# Patient Record
Sex: Female | Born: 1965
Health system: Southern US, Community
[De-identification: ages and names within clinical notes are randomized; demographics above are authoritative.]

## PROBLEM LIST (undated history)

## (undated) DIAGNOSIS — M25569 Pain in unspecified knee: Secondary | ICD-10-CM

## (undated) DIAGNOSIS — H547 Unspecified visual loss: Secondary | ICD-10-CM

## (undated) DIAGNOSIS — I10 Essential (primary) hypertension: Secondary | ICD-10-CM

## (undated) DIAGNOSIS — M549 Dorsalgia, unspecified: Secondary | ICD-10-CM

## (undated) HISTORY — DX: Morbid (severe) obesity due to excess calories: E66.01

## (undated) HISTORY — DX: Unspecified visual loss: H54.7

## (undated) HISTORY — DX: Dorsalgia, unspecified: M54.9

## (undated) HISTORY — DX: Pain in unspecified knee: M25.569

## (undated) HISTORY — DX: Essential (primary) hypertension: I10

---

## 2000-10-03 ENCOUNTER — Inpatient Hospital Stay (HOSPITAL_COMMUNITY): Admission: AD | Admit: 2000-10-03 | Discharge: 2000-10-03 | Payer: Self-pay | Admitting: *Deleted

## 2000-10-11 ENCOUNTER — Inpatient Hospital Stay (HOSPITAL_COMMUNITY): Admission: RE | Admit: 2000-10-11 | Discharge: 2000-10-14 | Payer: Self-pay | Admitting: Obstetrics & Gynecology

## 2000-10-17 ENCOUNTER — Inpatient Hospital Stay (HOSPITAL_COMMUNITY): Admission: AD | Admit: 2000-10-17 | Discharge: 2000-10-17 | Payer: Self-pay | Admitting: Obstetrics

## 2000-10-20 ENCOUNTER — Inpatient Hospital Stay (HOSPITAL_COMMUNITY): Admission: AD | Admit: 2000-10-20 | Discharge: 2000-10-20 | Payer: Self-pay | Admitting: *Deleted

## 2000-10-26 ENCOUNTER — Inpatient Hospital Stay (HOSPITAL_COMMUNITY): Admission: AD | Admit: 2000-10-26 | Discharge: 2000-10-26 | Payer: Self-pay | Admitting: Obstetrics & Gynecology

## 2002-01-28 ENCOUNTER — Encounter: Admission: RE | Admit: 2002-01-28 | Discharge: 2002-01-28 | Payer: Self-pay | Admitting: *Deleted

## 2002-01-28 ENCOUNTER — Encounter: Payer: Self-pay | Admitting: *Deleted

## 2002-01-28 ENCOUNTER — Ambulatory Visit (HOSPITAL_COMMUNITY): Admission: RE | Admit: 2002-01-28 | Discharge: 2002-01-28 | Payer: Self-pay | Admitting: *Deleted

## 2002-01-30 ENCOUNTER — Encounter: Admission: RE | Admit: 2002-01-30 | Discharge: 2002-01-30 | Payer: Self-pay | Admitting: *Deleted

## 2002-02-04 ENCOUNTER — Ambulatory Visit (HOSPITAL_COMMUNITY): Admission: RE | Admit: 2002-02-04 | Discharge: 2002-02-04 | Payer: Self-pay | Admitting: Obstetrics and Gynecology

## 2003-11-13 ENCOUNTER — Other Ambulatory Visit: Admission: RE | Admit: 2003-11-13 | Discharge: 2003-11-13 | Payer: Self-pay | Admitting: Physician Assistant

## 2013-01-09 DIAGNOSIS — H547 Unspecified visual loss: Secondary | ICD-10-CM

## 2013-01-09 HISTORY — DX: Unspecified visual loss: H54.7

## 2015-09-07 ENCOUNTER — Ambulatory Visit (INDEPENDENT_AMBULATORY_CARE_PROVIDER_SITE_OTHER): Payer: Self-pay | Admitting: Internal Medicine

## 2015-09-07 ENCOUNTER — Encounter: Payer: Self-pay | Admitting: Internal Medicine

## 2015-09-07 DIAGNOSIS — Z23 Encounter for immunization: Secondary | ICD-10-CM

## 2015-09-07 DIAGNOSIS — H547 Unspecified visual loss: Secondary | ICD-10-CM

## 2015-09-07 DIAGNOSIS — Z1239 Encounter for other screening for malignant neoplasm of breast: Secondary | ICD-10-CM

## 2015-09-07 DIAGNOSIS — M25562 Pain in left knee: Secondary | ICD-10-CM

## 2015-09-07 DIAGNOSIS — M25561 Pain in right knee: Secondary | ICD-10-CM

## 2015-09-07 NOTE — Progress Notes (Signed)
Subjective:    Patient ID: Emily Mcgee, female    DOB: 08/31/1965, 50 y.o.   MRN: [id number]  HPI   New patient to establish Originally from [place]  [place].  Concerned about weight:   States weighed between 140-150 lbs when young adult.  When started having children, of which she has 3, she started gaining weight "let go"  Of herself.  Has made intermittent attempts to increase her physical activity, but is not sustained.   Has knee pain when walks for any period of time. LIkes to get up in the morning, eventually goes back to sleep as she used to work 3rd shift and has not been able to get her sleep schedule back to normal range.  Works from 2 p.m. To 9 p.m., hours are less on other days.   Often eats dinner after 10 p.m. Reads, watches TV until 12 to 1 a.m.  In living room.  Goes to bed at 1-2 a.m.  Sleeps until late morning or noon.  But will get up 6:30 a.m. Now that school has started and once girl is out the door, around 8:30, back to bed.  Diet:   First meal around 1-2 p.m.:  Chic Fila:  Salad with chicken and coffee.   Snacks on Corn Nuts and peanuts and snacks on them all day long. May eat an apple,   Second meal:  Boiled white rice with some sort of meat:  Goat, beef, chicken, etc.  Vegetables with dinner.  Current Meds  Medication Sig  . aspirin 500 MG tablet Take 1,000 mg by mouth as needed for pain.  . Naproxen Sodium (ALEVE) 220 MG CAPS Take 2 capsules by mouth as needed.    No Known Allergies   Past Medical History:  Diagnosis Date  . Back pain   . Decreased visual acuity 2015   bifocals  . Knee pain   . Morbid obesity (HCC)     Past Surgical History:  Procedure Laterality Date  . CESAREAN SECTION  1995, 1997, 2002    Social History   Social History  . Marital status: Married    Spouse name: Emily Mcgee  . Number of children: 3  . Years of education: college   Occupational History  . [employer] managersecretary     Planning to go to school for  [employer]   Social History Main Topics  . Smoking status: Never Smoker  . Smokeless tobacco: Never Used  . Alcohol use 0.6 oz/week    1 Glasses of wine per week  . Drug use: No  . Sexual activity: Not on file   Other Topics Concern  . Not on file   Social History Narrative  . No narrative on file        Review of Systems     Objective:   Physical Exam Morbidly obese NAD Lungs:  CTA CV:  RRR with normal S1 and S2, NO S3, S4, or murmur, radial pulses normal and equal Extrems:  Obese, + DP pulses bilaterally.       Assessment & Plan:  1.  Morbid obesity:  Long discussion regarding making short term goals to meet long term goal of weight loss.   To set a regular schedule for herself with sleep and wake as well as meals. Work on how she eats and gradually increasing physical activity. FLP, CBC, CMP, TSH today  2.  Knee pain:  Water exercise or stationary bike to get weight off legs/joints.  Discussed  her pain will likely not be relieved until she loses weight.  3. Visual Acuity:  Referral to OPtometry and glass voucher  4.  Elevated BP:  Labs above.  Return for bp check with nurse in 1 week.  If still as elevated, start medication

## 2015-09-07 NOTE — Patient Instructions (Addendum)
Drink a glass of water before every meal Drink 6-8 glasses of water daily Eat three meals daily Eat a protein and healthy fat with every meal (eggs,fish, chicken, [place]key and limit red meats) Eat 5 servings of vegetables daily, mix the colors Eat 2 servings of fruit daily with skin, if skin is edible Use smaller plates Put food/utensils down as you chew and swallow each bite Eat at a table with friends/family at least once daily, no TV Do not eat in front of the TV  Get out your calendar and get started on writing down short term goals --get a daily schedule written out for sleep, meals, snacks, and physical activity! Remember to be consistent on a daily basis with how you eat and are physically active

## 2015-09-08 LAB — CBC WITH DIFFERENTIAL/PLATELET
Basophils Absolute: 0 10*3/uL (ref 0.0–0.2)
Basos: 0 %
EOS (ABSOLUTE): 0.2 10*3/uL (ref 0.0–0.4)
Eos: 3 %
Hematocrit: 37.7 % (ref 34.0–46.6)
Hemoglobin: 11.8 g/dL (ref 11.1–15.9)
Immature Grans (Abs): 0 10*3/uL (ref 0.0–0.1)
Immature Granulocytes: 0 %
Lymphocytes Absolute: 2.2 10*3/uL (ref 0.7–3.1)
Lymphs: 31 %
MCH: 24.7 pg — ABNORMAL LOW (ref 26.6–33.0)
MCHC: 31.3 g/dL — ABNORMAL LOW (ref 31.5–35.7)
MCV: 79 fL (ref 79–97)
Monocytes Absolute: 0.4 10*3/uL (ref 0.1–0.9)
Monocytes: 6 %
Neutrophils Absolute: 4.3 10*3/uL (ref 1.4–7.0)
Neutrophils: 60 %
Platelets: 283 10*3/uL (ref 150–379)
RBC: 4.78 x10E6/uL (ref 3.77–5.28)
RDW: 16.6 % — ABNORMAL HIGH (ref 12.3–15.4)
WBC: 7 10*3/uL (ref 3.4–10.8)

## 2015-09-08 LAB — LIPID PANEL W/O CHOL/HDL RATIO
Cholesterol, Total: 229 mg/dL — ABNORMAL HIGH (ref 100–199)
HDL: 61 mg/dL (ref >39)
LDL Calculated: 155 mg/dL — ABNORMAL HIGH (ref 0–99)
Triglycerides: 65 mg/dL (ref 0–149)
VLDL Cholesterol Cal: 13 mg/dL (ref 5–40)

## 2015-09-08 LAB — COMPREHENSIVE METABOLIC PANEL
ALT: 15 IU/L (ref 0–32)
AST: 13 IU/L (ref 0–40)
Albumin/Globulin Ratio: 1.1 — ABNORMAL LOW (ref 1.2–2.2)
Albumin: 3.7 g/dL (ref 3.5–5.5)
Alkaline Phosphatase: 59 IU/L (ref 39–117)
BUN/Creatinine Ratio: 20 (ref 9–23)
BUN: 16 mg/dL (ref 6–24)
Bilirubin Total: 0.4 mg/dL (ref 0.0–1.2)
CO2: 29 mmol/L (ref 18–29)
Calcium: 9.1 mg/dL (ref 8.7–10.2)
Chloride: 102 mmol/L (ref 96–106)
Creatinine, Ser: 0.79 mg/dL (ref 0.57–1.00)
GFR calc Af Amer: 100 mL/min/{1.73_m2} (ref >59)
GFR calc non Af Amer: 87 mL/min/{1.73_m2} (ref >59)
Globulin, Total: 3.4 g/dL (ref 1.5–4.5)
Glucose: 89 mg/dL (ref 65–99)
Potassium: 4.2 mmol/L (ref 3.5–5.2)
Sodium: 144 mmol/L (ref 134–144)
Total Protein: 7.1 g/dL (ref 6.0–8.5)

## 2015-09-08 LAB — TSH: TSH: 1.28 u[IU]/mL (ref 0.450–4.500)

## 2015-09-15 ENCOUNTER — Ambulatory Visit (INDEPENDENT_AMBULATORY_CARE_PROVIDER_SITE_OTHER): Payer: Self-pay

## 2015-09-15 VITALS — BP 152/90 | HR 69

## 2015-09-15 DIAGNOSIS — Z111 Encounter for screening for respiratory tuberculosis: Secondary | ICD-10-CM

## 2015-09-15 DIAGNOSIS — R030 Elevated blood-pressure reading, without diagnosis of hypertension: Secondary | ICD-10-CM

## 2015-09-15 DIAGNOSIS — IMO0001 Reserved for inherently not codable concepts without codable children: Secondary | ICD-10-CM

## 2015-09-15 MED ORDER — LISINOPRIL 5 MG PO TABS: 5.0000 mg | ORAL_TABLET | Freq: Every day | ORAL | 11 refills | Status: DC

## 2015-09-15 NOTE — Patient Instructions (Signed)
Start Lisinopril and follow-up in one week. Come back in 48-72 hours for PPD reading

## 2015-09-17 LAB — TB SKIN TEST: TB Skin Test: POSITIVE

## 2015-09-17 NOTE — Progress Notes (Signed)
Patient had a PPD skin test placed on 09-15-2015 at 11:10am and was read on 09/17/2015 at 12:45pm. 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 [facility]

## 2015-09-20 ENCOUNTER — Ambulatory Visit
Admission: RE | Admit: 2015-09-20 | Discharge: 2015-09-20 | Disposition: A | Discharge status: Home / Self Care | Payer: No Typology Code available for payment source | Source: Ambulatory Visit | Attending: Infectious Disease | Admitting: Infectious Disease

## 2015-09-20 ENCOUNTER — Other Ambulatory Visit: Payer: Self-pay | Admitting: Infectious Disease

## 2015-09-20 DIAGNOSIS — R7611 Nonspecific reaction to tuberculin skin test without active tuberculosis: Secondary | ICD-10-CM

## 2015-09-29 ENCOUNTER — Ambulatory Visit (INDEPENDENT_AMBULATORY_CARE_PROVIDER_SITE_OTHER): Payer: Self-pay

## 2015-09-29 VITALS — BP 148/82 | HR 74

## 2015-09-29 DIAGNOSIS — Z79899 Other long term (current) drug therapy: Secondary | ICD-10-CM

## 2015-09-29 NOTE — Progress Notes (Signed)
Patient came in this afternoon for a BMP and blood pressure check. She asked about her TB [facility] referral [facility]. She says they sent her for a chest x-ray and after the results there was supposed to be a three specimen sputum test but she could not give a sample. She explained that the health department was supposed to fax something over to this office so more testing can be done. I am not aware of anything coming over from the health department for her. I faxed a request over today for the information and am calling the clinic. 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

## 2015-09-30 LAB — BASIC METABOLIC PANEL
BUN/Creatinine Ratio: 15 (ref 9–23)
BUN: 12 mg/dL (ref 6–24)
CALCIUM: 8.9 mg/dL (ref 8.7–10.2)
CHLORIDE: 100 mmol/L (ref 96–106)
CO2: 30 mmol/L — AB (ref 18–29)
Creatinine, Ser: 0.8 mg/dL (ref 0.57–1.00)
GFR calc Af Amer: 99 mL/min/{1.73_m2} (ref >59)
GFR calc non Af Amer: 86 mL/min/{1.73_m2} (ref >59)
Glucose: 86 mg/dL (ref 65–99)
POTASSIUM: 4.4 mmol/L (ref 3.5–5.2)
Sodium: 141 mmol/L (ref 134–144)

## 2015-10-08 ENCOUNTER — Telehealth: Payer: Self-pay | Admitting: Internal Medicine

## 2015-10-08 DIAGNOSIS — R9389 Abnormal findings on diagnostic imaging of other specified body structures: Secondary | ICD-10-CM

## 2015-10-08 DIAGNOSIS — R7611 Nonspecific reaction to tuberculin skin test without active tuberculosis: Secondary | ICD-10-CM

## 2015-10-08 NOTE — Telephone Encounter (Signed)
[facility] scheduled [facility] Appointment is on 10/11/2015 at 4:00 pm.

## 2015-10-08 NOTE — Telephone Encounter (Signed)
[facility] is okay [facility]. Patient will apply for financial assistance.

## 2015-10-08 NOTE — Telephone Encounter (Signed)
Patient aware of appointment and will stop by the office to pick up application for financial assist.

## 2015-10-11 ENCOUNTER — Ambulatory Visit (HOSPITAL_COMMUNITY)
Admission: RE | Admit: 2015-10-11 | Discharge: 2015-10-11 | Disposition: A | Discharge status: Home / Self Care | Payer: Self-pay | Source: Ambulatory Visit | Attending: Internal Medicine | Admitting: Internal Medicine

## 2015-10-11 DIAGNOSIS — R9389 Abnormal findings on diagnostic imaging of other specified body structures: Secondary | ICD-10-CM

## 2015-10-11 DIAGNOSIS — R918 Other nonspecific abnormal finding of lung field: Secondary | ICD-10-CM | POA: Insufficient documentation

## 2015-10-11 DIAGNOSIS — R7611 Nonspecific reaction to tuberculin skin test without active tuberculosis: Secondary | ICD-10-CM | POA: Insufficient documentation

## 2015-10-11 DIAGNOSIS — R938 Abnormal findings on diagnostic imaging of other specified body structures: Secondary | ICD-10-CM | POA: Insufficient documentation

## 2015-10-12 NOTE — Progress Notes (Signed)
[facility] printed [facility] on 16/10/960410/03/2015.

## 2015-11-30 NOTE — Progress Notes (Signed)
This task was completed.

## 2015-12-07 ENCOUNTER — Ambulatory Visit: Payer: Self-pay | Admitting: Internal Medicine

## 2016-01-24 ENCOUNTER — Ambulatory Visit: Payer: Self-pay | Admitting: Internal Medicine

## 2016-02-09 ENCOUNTER — Ambulatory Visit: Payer: Self-pay | Admitting: Internal Medicine

## 2016-02-21 ENCOUNTER — Ambulatory Visit: Payer: Self-pay | Admitting: Internal Medicine

## 2016-02-21 ENCOUNTER — Encounter: Payer: Self-pay | Admitting: Internal Medicine

## 2016-02-21 ENCOUNTER — Ambulatory Visit (INDEPENDENT_AMBULATORY_CARE_PROVIDER_SITE_OTHER): Payer: Self-pay | Admitting: Internal Medicine

## 2016-02-21 VITALS — BP 132/90 | HR 70 | Resp 12 | Ht 61.5 in | Wt 350.0 lb

## 2016-02-21 DIAGNOSIS — Z1231 Encounter for screening mammogram for malignant neoplasm of breast: Secondary | ICD-10-CM

## 2016-02-21 DIAGNOSIS — Z124 Encounter for screening for malignant neoplasm of cervix: Secondary | ICD-10-CM

## 2016-02-21 DIAGNOSIS — Z0000 Encounter for general adult medical examination without abnormal findings: Secondary | ICD-10-CM

## 2016-02-21 DIAGNOSIS — H547 Unspecified visual loss: Secondary | ICD-10-CM

## 2016-02-21 DIAGNOSIS — Z1239 Encounter for other screening for malignant neoplasm of breast: Secondary | ICD-10-CM

## 2016-02-21 LAB — POCT WET PREP WITH KOH
KOH Prep POC: NEGATIVE
RBC Wet Prep HPF POC: NEGATIVE
TRICHOMONAS UA: NEGATIVE
YEAST WET PREP PER HPF POC: NEGATIVE

## 2016-02-21 NOTE — Patient Instructions (Addendum)
Can google "advance directives, [place]"  And bring up form from Secretary of State. Print and fill out Or can go to "5 wishes"  Which is also in Spanish and fill out--this costs $5--perhaps easier to use. Designate a Medical Power of Attorney to speak for you if you are unable to speak for yourself when ill or injured  Drink a glass of water before every meal Drink 6-8 glasses of water daily Eat three meals daily Eat a protein and healthy fat with every meal (eggs,fish, chicken, turkey and limit red meats) Eat 5 servings of vegetables daily, mix the colors Eat 2 servings of fruit daily with skin, if skin is edible Use smaller plates Put food/utensils down as you chew and swallow each bite Eat at a table with friends/family at least once daily, no TV Do not eat in front of the TV 

## 2016-02-21 NOTE — Progress Notes (Signed)
Subjective:    Patient ID: Emily Mcgee, female    DOB: 09/15/1965, 51 y.o.   MRN: [id number]  HPI  CPE with pap  1.  Pap:  Last pap at [facility].  Was normal as always.  Thinks was 2 years ago.  No family history.  2.  Mammogram:  Had one before age 69 yo.  Not sure why she had it.  Normal. Was here in [place] somewhere.  No family history  3.  Osteoprevention:  Eats yogurt, but not every day.  Drinks milk rarely and only in coffee.  Does take multivitamin, but not calcium and Vitamin D specifically.  Does walk in her apartment complex, maybe for 20 minutes.  4.  Guaiac Cards:  Never.  No family history.  5.  Colonoscopy:  Never.  6.  Immunizations:  Immunization History  Administered Date(s) Administered  . Influenza,inj,Quad PF,36+ Mos 10/23/2015  . PPD Test 09/15/2015  . Tdap 09/07/2015   Current Meds  Medication Sig  . [DISCONTINUED] aspirin 500 MG tablet Take 1,000 mg by mouth as needed for pain.    No Known Allergies   Past Medical History:  Diagnosis Date  . Back pain   . Decreased visual acuity 2015   bifocals  . Knee pain   . Morbid obesity (HCC)     Past Surgical History:  Procedure Laterality Date  . CESAREAN SECTION  1995, 1997, 2002    Family History  Problem Relation Age of Onset  . Stroke Father     Social History   Social History  . Marital status: Married    Spouse name: Emily Mcgee  . Number of children: 3  . Years of education: college   Occupational History  . 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 Smoking status: Never Smoker  . Smokeless tobacco: Never Used  . Alcohol use 0.6 oz/week    1 Glasses of wine per week     Comment: rare  . Drug use: No  . Sexual activity: No   Other Topics Concern  . Not on file   Social History Narrative   Originally from [place]   Came to [place]. In 1999   Lives at home with 3 children and grandson.   Husband in  [place], he comes and goes from [place].    Review of Systems  Constitutional: Negative for appetite change and fatigue.  HENT: Negative for dental problem, ear pain, hearing loss and sneezing.   Eyes: Positive for visual disturbance.       Needs voucher for eyeglass Rx  Respiratory: Negative for cough.   Cardiovascular: Negative for chest pain, palpitations and leg swelling.  Gastrointestinal: Negative for abdominal pain, blood in stool, constipation, diarrhea, nausea and vomiting.       No melena  Genitourinary: Negative for dysuria, frequency and vaginal discharge.  Musculoskeletal: Positive for arthralgias.       Chronic knee pain .  Received an Rx from Dr. Roderic Palau at her church, but did not help.  Asked her to pick one primary care doctor for safety issues  Skin: Negative for rash.  Neurological: Negative for weakness and numbness.  Hematological: Negative for adenopathy. Does not bruise/bleed easily.  Psychiatric/Behavioral: Negative for dysphoric mood. The patient is not nervous/anxious.        Objective:   Physical Exam  Constitutional: She is oriented to person, place, and time. She appears  well-developed and well-nourished.  Morbidly obese  HENT:  Head: Normocephalic and atraumatic.  Right Ear: Hearing, tympanic membrane, external ear and ear canal normal.  Left Ear: Hearing, tympanic membrane, external ear and ear canal normal.  Nose: Nose normal.  Mouth/Throat: Uvula is midline, oropharynx is clear and moist and mucous membranes are normal.  Eyes: Conjunctivae and EOM are normal. Pupils are equal, round, and reactive to light.  Discs sharp bilaterally  Neck: Normal range of motion. Neck supple. No thyroid mass and no thyromegaly present.  Cardiovascular: Normal rate, regular rhythm, S1 normal and S2 normal.  Exam reveals no S3, no S4 and no friction rub.   No murmur heard. No carotid bruits.  Carotid, radial, femoral, DP and PT pulses normal and equal.   Pulmonary/Chest:  Effort normal and breath sounds normal. Right breast exhibits no inverted nipple, no mass, no nipple discharge, no skin change and no tenderness. Left breast exhibits no inverted nipple, no mass, no nipple discharge, no skin change and no tenderness.  Large pendulous breasts.  Abdominal: Soft. Bowel sounds are normal. She exhibits no mass. There is no hepatosplenomegaly. There is no tenderness. No hernia.  Genitourinary: Vagina normal. There is no rash on the right labia. There is no rash on the left labia. Cervix exhibits no motion tenderness. Right adnexum displays no mass and no tenderness. Left adnexum displays no mass and no tenderness.  Genitourinary Comments: Difficulty fully visualizing lower spect of cervix, small uterus quite high with cervix retroverted.  Scant white/light yellow vaginal discharge.  Musculoskeletal: Normal range of motion.  Left knee with tenderness more so at pes anserine bursa site.  No definite joint line tenderness or pain/laxity with stress of cruciates or collateral ligaments.  Lymphadenopathy:       Head (right side): No submental and no submandibular adenopathy present.       Head (left side): No submental and no submandibular adenopathy present.    She has no cervical adenopathy.    She has no axillary adenopathy.       Right: No inguinal and no supraclavicular adenopathy present.       Left: No inguinal and no supraclavicular adenopathy present.  Neurological: She is alert and oriented to person, place, and time. She has normal strength and normal reflexes. She displays normal reflexes. No cranial nerve deficit or sensory deficit. Coordination and gait normal.  Skin: Skin is warm and dry. No lesion and no rash noted.  Psychiatric: She has a normal mood and affect. Her speech is normal and behavior is normal. Judgment and thought content normal. Cognition and memory are normal.          Assessment & Plan:  1.  CPE with pap: Mammogram to be scheduled. Had  labs in fall. Wet prep with clue cells only and asymptomatic.  2.  Morbid obesity:  Encourage Y app and get into pool for physical activity with knee pain and obesity. Long discussion regarding changing diet. Follow up for lifestyle changes in  6 weeks and bp check  3.  Essential Hypertension:  Encouraged to get back on Lisinopril.  BP check in 6 weeks.

## 2016-02-24 LAB — CYTOLOGY - PAP

## 2016-02-28 NOTE — Progress Notes (Signed)
Left message for patient

## 2016-04-03 ENCOUNTER — Ambulatory Visit: Payer: Self-pay | Admitting: Internal Medicine

## 2017-08-07 ENCOUNTER — Encounter: Payer: Self-pay | Admitting: Family Medicine

## 2017-08-07 ENCOUNTER — Ambulatory Visit: Payer: Self-pay | Admitting: Family Medicine

## 2017-08-07 DIAGNOSIS — I10 Essential (primary) hypertension: Secondary | ICD-10-CM

## 2017-08-07 DIAGNOSIS — M25561 Pain in right knee: Secondary | ICD-10-CM

## 2017-08-07 NOTE — Patient Instructions (Signed)
Good to see you today!  Thanks for coming in.  For your knee pain - will order an xray - [facility] and apply for financial aid - keep taking aleve but add Tylenol arthritis up to three times a day  - also try Aspercreme four times a day  Congratulations on your weight loss - keep going !  That will definitely help with your knee pain   Check your blood pressure at our church.  It should be less than 140/90. Write down the readings and bring in to Dr Delrae AlfredMulberry  Come back in one -two months

## 2017-08-07 NOTE — Assessment & Plan Note (Signed)
Most likely DJD.  Never had an xray.  Given age need to rule out occult cancer or fracture,   Try otc analgesics - see after visit summary .  Encourage weight loss

## 2017-08-07 NOTE — Progress Notes (Signed)
Subjective  Emily Mcgee is a 52 y.o. female is presenting with the following  JOINT PAIN Location: R knee all over.   Course:  Has had on an off for 3 years.   Is worse with activity or after sitting  with:  Aleve helps some Trauma: no Swelling:  She thinks is swollen sometime Locking:  no Other Joints involved:  no Rash: no Fever:  no  HYPERTENSION Disease Monitoring  Home BP Monitoring (Severity) has not been taking. Feels is high today because worked all night Symptoms - Chest pain- no    Dyspnea- no Medications (Modifying factors) Compliance-  Has not taken any medications for months. Lightheadedness-  no  Edema- no Timing - continuous  Duration - years ROS - See HPI  OBESITY Has lost almost 40 lbs.  Trying to eat better and getting some exercise.  She was surprised how much she had lost Hopes to lose more.  PMH Lab Review   Potassium  Date Value Ref Range Status  09/29/2015 4.4 3.5 - 5.2 mmol/L Final   Sodium  Date Value Ref Range Status  09/29/2015 141 134 - 144 mmol/L Final   Creatinine, Ser  Date Value Ref Range Status  09/29/2015 0.80 0.57 - 1.00 mg/dL Final          Chief Complaint noted Review of Symptoms - see HPI PMH - Smoking status noted.    Objective Vital Signs reviewed BP (!) 150/100 (BP Location: Left Arm, Patient Position: Sitting, Cuff Size: Large)   Pulse 84   Resp 14   Ht 5' 1.5" (1.562 m)   Wt (!) 312 lb (141.5 kg)   LMP  (LMP Unknown)   BMI 58.00 kg/m  Knees - very obese with no observable land marks.  FROM with mild pain. Unable to tell if has an effusion.  Ankle and hip exams normal.  Is tender diffusely with no detectavble laxity No  Pedal edema  Assessments/Plans  See after visit summary for details of patient instuctions  Right knee pain Most likely DJD.  Never had an xray.  Given age need to rule out occult cancer or fracture,   Try otc analgesics - see after visit summary .  Encourage weight loss   Morbid obesity  (HCC) Some improvement.  Continue current approaches   Hypertension BP Readings from Last 3 Encounters:  08/07/17 (!) 150/100  02/21/16 132/90  09/29/15 (!) 148/82   Not at goal.  Monitor and bring in readings

## 2017-08-07 NOTE — Assessment & Plan Note (Signed)
Some improvement.  Continue current approaches

## 2017-08-07 NOTE — Assessment & Plan Note (Signed)
BP Readings from Last 3 Encounters:  08/07/17 (!) 150/100  02/21/16 132/90  09/29/15 (!) 148/82   Not at goal.  Monitor and bring in readings

## 2017-09-20 ENCOUNTER — Ambulatory Visit: Payer: Self-pay

## 2017-10-02 IMAGING — CT CT CHEST W/O CM
2 of 4 series · 15 of 36 positions shown, 18 images · non-contrast
Comparison: Chest radiograph 09/20/2015

CLINICAL DATA: Positive PPD with abnormal chest x-ray.

EXAM:
CT CHEST WITHOUT CONTRAST
TECHNIQUE: Multidetector CT imaging of the chest was performed following the
standard protocol without IV contrast.

[Series 2: chest w/o 2mm st · axial · non-contrast · 0.61mm/px · z∈[-146,+90]mm · 12 of 140 slices shown, 15 images]
[im 11/140  mediastinal]
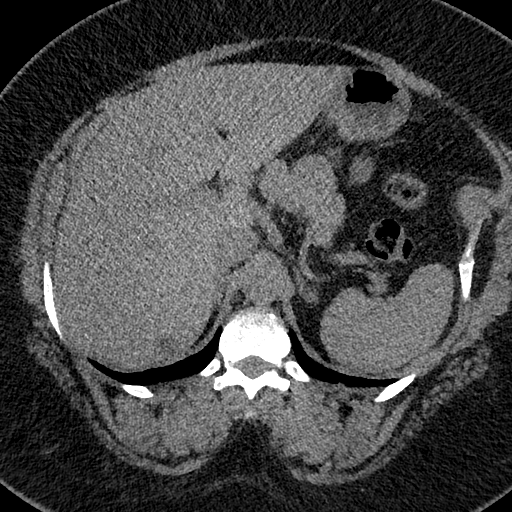
[im 11/140  lung]
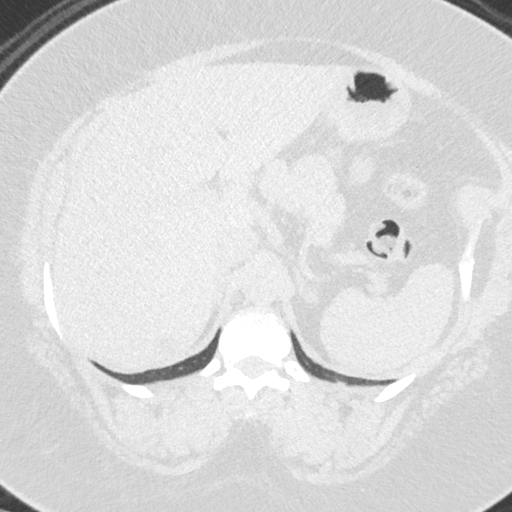
[im 22/140  lung]
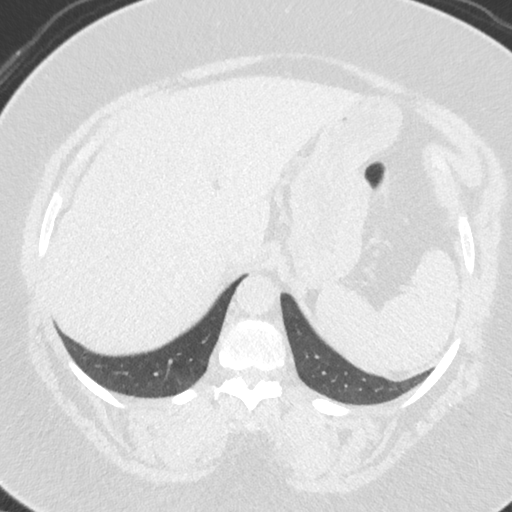
[im 33/140  lung]
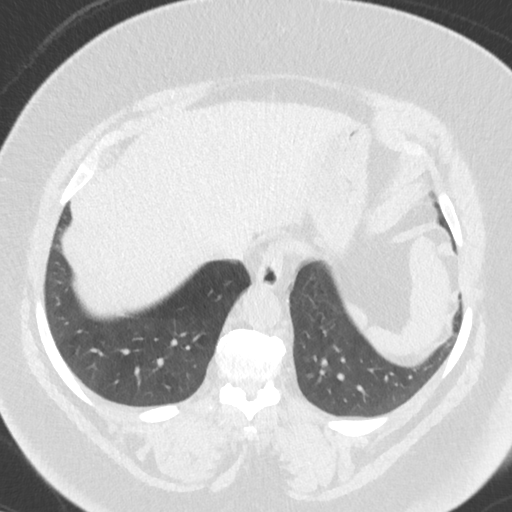
[im 43/140  lung]
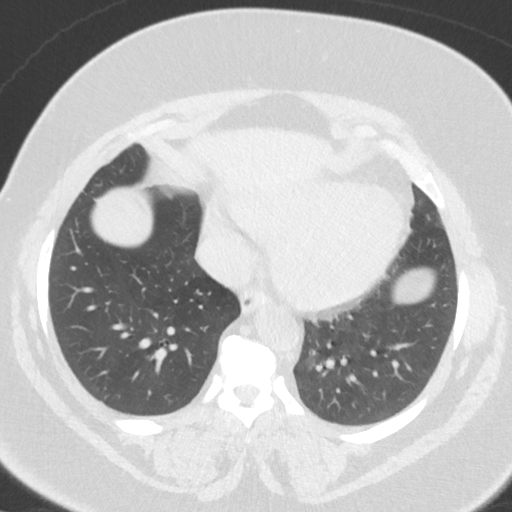
[im 54/140  mediastinal]
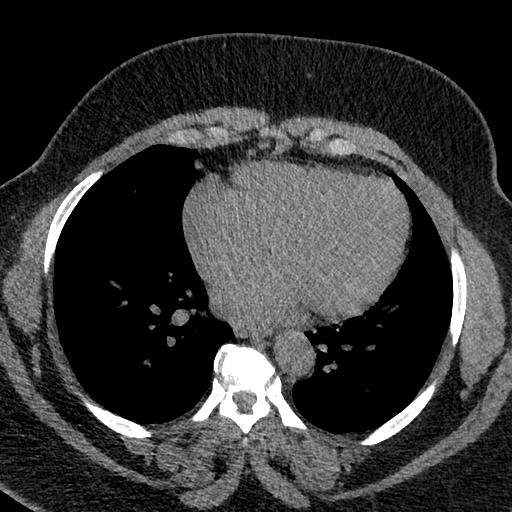
[im 54/140  lung]
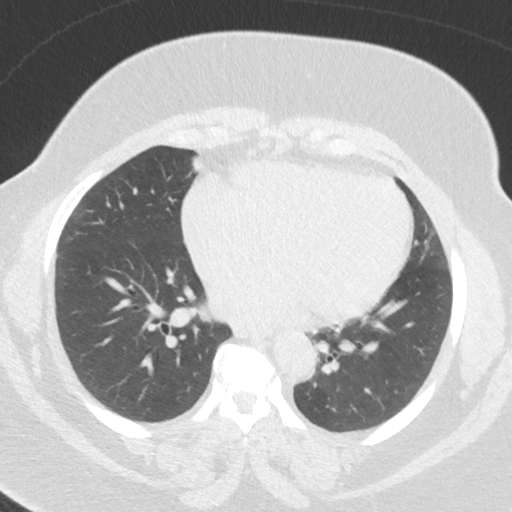
[im 65/140  lung]
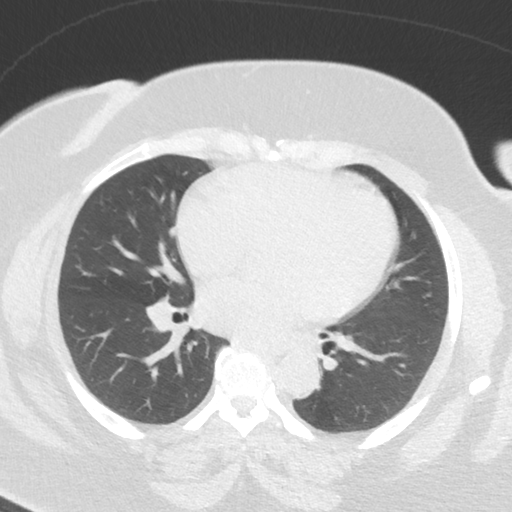
[im 75/140  lung]
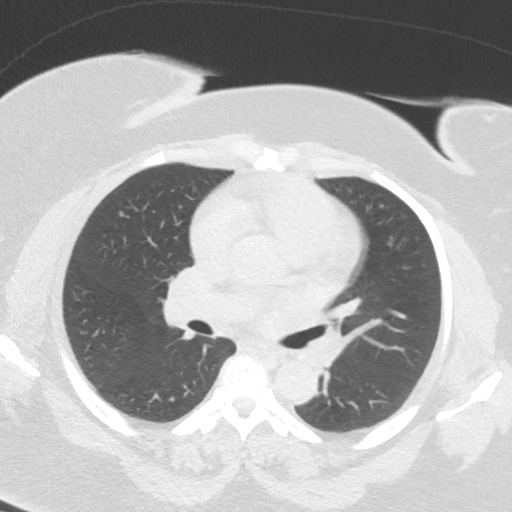
[im 86/140  lung]
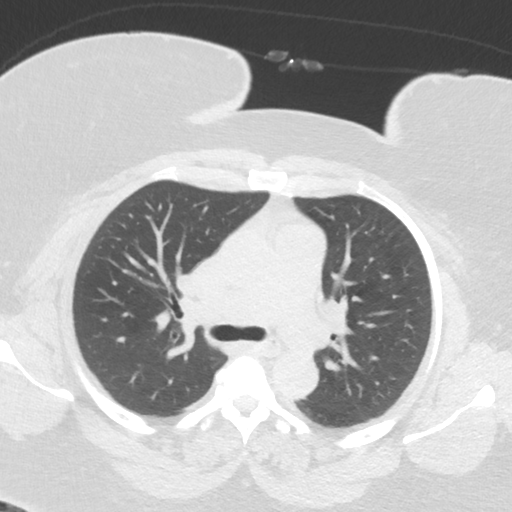
[im 97/140  mediastinal]
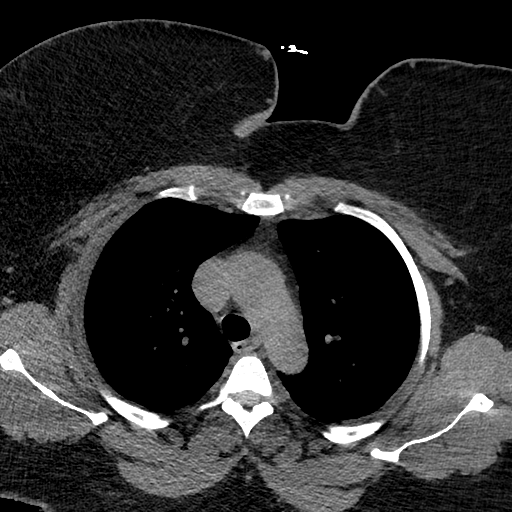
[im 97/140  lung]
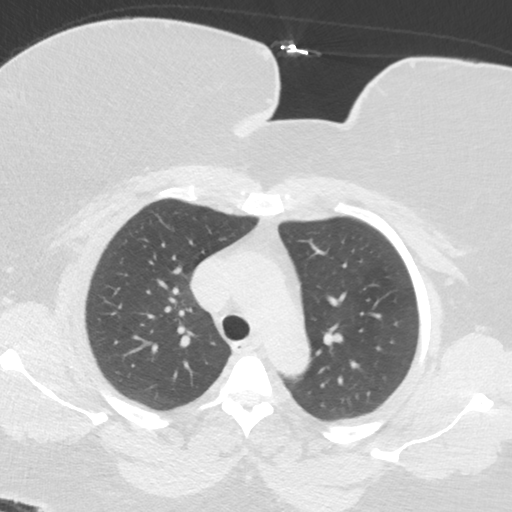
[im 107/140  lung]
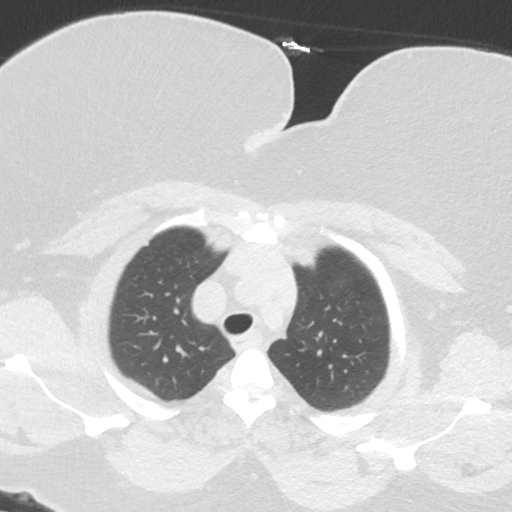
[im 118/140  lung]
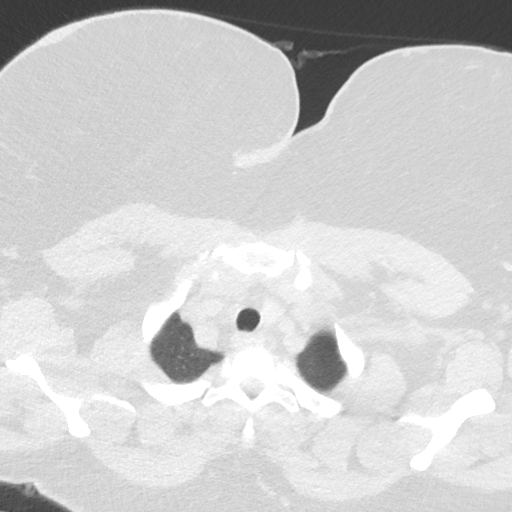
[im 129/140  lung]
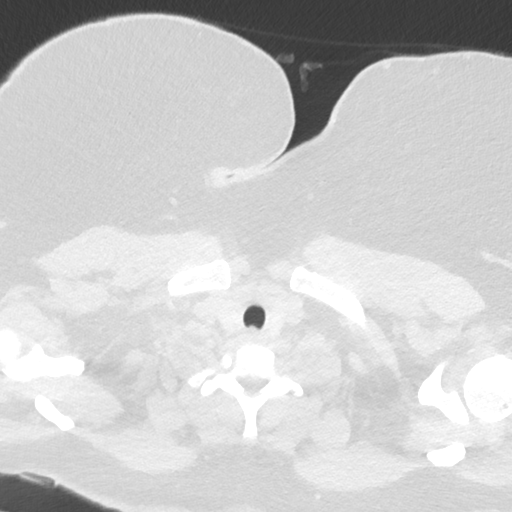

[Series 5: chest w/o 3mm st cor · coronal · non-contrast · 0.53mm/px · 3 of 101 slices shown]
[im 21/101  lung]
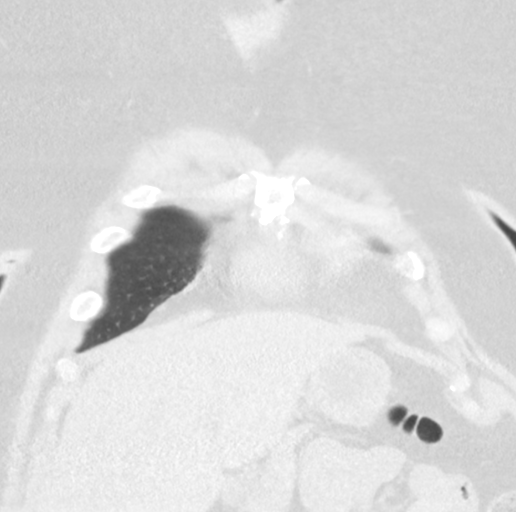
[im 41/101  lung]
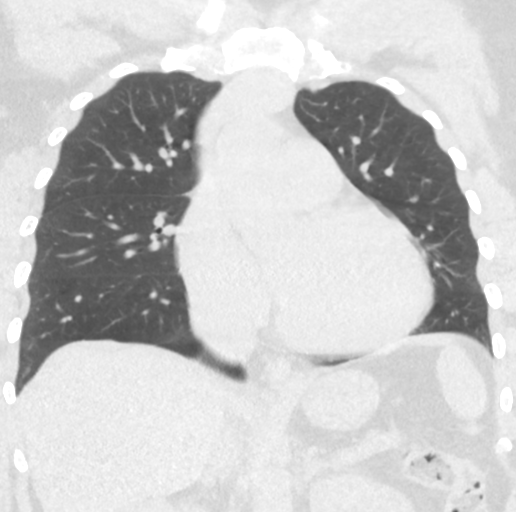
[im 61/101  lung]
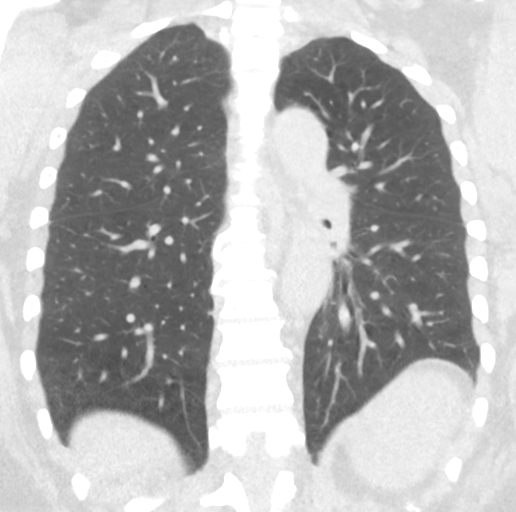

[15 of 36 positions shown; findings below may reference images not displayed]

FINDINGS: Cardiovascular: No significant vascular findings. Normal heart size.
No pericardial effusion.

Mediastinum/Nodes: No enlarged mediastinal or axillary lymph nodes.
Thyroid gland, trachea, and esophagus demonstrate no significant
findings.

Lungs/Pleura: There are 4 less than 5 mm solid and sub solid, mostly
perifissural nodules in the right middle lobe. Mild hypoventilatory
changes versus scarring is seen in the lingula. No evidence of
airspace consolidation, chronic inflammatory changes such as
bronchiectasis, pleural effusion or pneumothorax.

Upper Abdomen: 2.2 cm benign-appearing partially exophytic right
lobe liver cyst, otherwise unremarkable.

Musculoskeletal: No chest wall mass or suspicious bone lesions
identified.
IMPRESSION: Four less than 5 mm solid and sub solid mostly perifissural nodules
in the right middle lobe, likely benign. Non-contrast chest CT at
3-6 months is recommended. If nodules persist and are stable at that
time, consider additional non-contrast chest CT examinations at 2
and 4 years. This recommendation follows the consensus statement:
Guidelines for Management of Incidental Pulmonary Nodules Detected
[DATE].

No evidence of active pulmonary tuberculosis.

## 2017-10-05 ENCOUNTER — Other Ambulatory Visit: Payer: Self-pay

## 2017-10-05 ENCOUNTER — Ambulatory Visit: Payer: Self-pay | Attending: Family Medicine | Admitting: Family Medicine

## 2017-10-05 ENCOUNTER — Encounter: Payer: Self-pay | Admitting: Family Medicine

## 2017-10-05 VITALS — BP 132/85 | HR 83 | Temp 98.4°F | Resp 22 | Ht 63.5 in | Wt 363.8 lb

## 2017-10-05 DIAGNOSIS — M25561 Pain in right knee: Secondary | ICD-10-CM

## 2017-10-05 DIAGNOSIS — K0889 Other specified disorders of teeth and supporting structures: Secondary | ICD-10-CM

## 2017-10-05 DIAGNOSIS — G8929 Other chronic pain: Secondary | ICD-10-CM

## 2017-10-05 DIAGNOSIS — I10 Essential (primary) hypertension: Secondary | ICD-10-CM | POA: Insufficient documentation

## 2017-10-05 DIAGNOSIS — Z23 Encounter for immunization: Secondary | ICD-10-CM

## 2017-10-05 MED ORDER — IBUPROFEN 600 MG PO TABS: 600.0000 mg | ORAL_TABLET | Freq: Three times a day (TID) | ORAL | 3 refills | Status: DC | PRN

## 2017-10-05 MED ORDER — AMOXICILLIN 500 MG PO CAPS: 500.0000 mg | ORAL_CAPSULE | Freq: Two times a day (BID) | ORAL | 0 refills | Status: DC

## 2017-10-05 MED FILL — AMOXICILLIN 500 MG CAPSULE: 500 | 10 days supply | Qty: 20 | Fill #0

## 2017-10-05 MED FILL — IBUPROFEN 600 MG TABLET: 600 | 20 days supply | Qty: 60 | Fill #0

## 2017-10-05 NOTE — Progress Notes (Signed)
Knee and tooth pain over 1 year.  Knee pain 7/10- intermittent x 2 yrs.  Dental pain x 1 year. Progressed x 4 days.

## 2017-10-05 NOTE — Patient Instructions (Addendum)
Arthritis Arthritis means joint pain. It can also mean joint disease. A joint is a place where bones come together. People who have arthritis may have:  Red joints.  Swollen joints.  Stiff joints.  Warm joints.  A fever.  A feeling of being sick.  Follow these instructions at home: Pay attention to any changes in your symptoms. Take these actions to help with your pain and swelling. Medicines  Take over-the-counter and prescription medicines only as told by your doctor.  Do not take aspirin for pain if your doctor says that you may have gout. Activity  Rest your joint if your doctor tells you to.  Avoid activities that make the pain worse.  Exercise your joint regularly as told by your doctor. Try doing exercises like: ? Swimming. ? Water aerobics. ? Biking. ? Walking. Joint Care   If your joint is swollen, keep it raised (elevated) if told by your doctor.  If your joint feels stiff in the morning, try taking a warm shower.  If you have diabetes, do not apply heat without asking your doctor.  If told, apply heat to the joint: ? Put a towel between the joint and the hot pack or heating pad. ? Leave the heat on the area for 20-30 minutes.  If told, apply ice to the joint: ? Put ice in a plastic bag. ? Place a towel between your skin and the bag. ? Leave the ice on for 20 minutes, 2-3 times per day.  Keep all follow-up visits as told by your doctor. Contact a doctor if:  The pain gets worse.  You have a fever. Get help right away if:  You have very bad pain in your joint.  You have swelling in your joint.  Your joint is red.  Many joints become painful and swollen.  You have very bad back pain.  Your leg is very weak.  You cannot control your pee (urine) or poop (stool). This information is not intended to replace advice given to you by your health care provider. Make sure you discuss any questions you have with your health care provider. Document  Released: 03/22/2009 Document Revised: 06/03/2015 Document Reviewed: 03/23/2014 Elsevier Interactive Patient Education  2018 Elsevier Inc.   Influenza Virus Vaccine injection (Fluarix) What is this medicine? INFLUENZA VIRUS VACCINE (in floo EN zuh VAHY ruhs vak SEEN) helps to reduce the risk of getting influenza also known as the flu. This medicine may be used for other purposes; ask your health care provider or pharmacist if you have questions. COMMON BRAND NAME(S): Fluarix, Fluzone What should I tell my health care provider before I take this medicine? They need to know if you have any of these conditions: -bleeding disorder like hemophilia -fever or infection -Guillain-Barre syndrome or other neurological problems -immune system problems -infection with the human immunodeficiency virus (HIV) or AIDS -low blood platelet counts -multiple sclerosis -an unusual or allergic reaction to influenza virus vaccine, eggs, chicken proteins, latex, gentamicin, other medicines, foods, dyes or preservatives -pregnant or trying to get pregnant -breast-feeding How should I use this medicine? This vaccine is for injection into a muscle. It is given by a health care professional. A copy of Vaccine Information Statements will be given before each vaccination. Read this sheet carefully each time. The sheet may change frequently. Talk to your pediatrician regarding the use of this medicine in children. Special care may be needed. Overdosage: If you think you have taken too much of this medicine contact a  poison control center or emergency room at once. NOTE: This medicine is only for you. Do not share this medicine with others. What if I miss a dose? This does not apply. What may interact with this medicine? -chemotherapy or radiation therapy -medicines that lower your immune system like etanercept, anakinra, infliximab, and adalimumab -medicines that treat or prevent blood clots like  warfarin -phenytoin -steroid medicines like prednisone or cortisone -theophylline -vaccines This list may not describe all possible interactions. Give your health care provider a list of all the medicines, herbs, non-prescription drugs, or dietary supplements you use. Also tell them if you smoke, drink alcohol, or use illegal drugs. Some items may interact with your medicine. What should I watch for while using this medicine? Report any side effects that do not go away within 3 days to your doctor or health care professional. Call your health care provider if any unusual symptoms occur within 6 weeks of receiving this vaccine. You may still catch the flu, but the illness is not usually as bad. You cannot get the flu from the vaccine. The vaccine will not protect against colds or other illnesses that may cause fever. The vaccine is needed every year. What side effects may I notice from receiving this medicine? Side effects that you should report to your doctor or health care professional as soon as possible: -allergic reactions like skin rash, itching or hives, swelling of the face, lips, or tongue Side effects that usually do not require medical attention (report to your doctor or health care professional if they continue or are bothersome): -fever -headache -muscle aches and pains -pain, tenderness, redness, or swelling at site where injected -weak or tired This list may not describe all possible side effects. Call your doctor for medical advice about side effects. You may report side effects to FDA at 1-800-FDA-1088. Where should I keep my medicine? This vaccine is only given in a clinic, pharmacy, doctor's office, or other health care setting and will not be stored at home. NOTE: This sheet is a summary. It may not cover all possible information. If you have questions about this medicine, talk to your doctor, pharmacist, or health care provider.  2018 Elsevier/Gold Standard (2007-07-24  09:30:40)

## 2017-10-05 NOTE — Progress Notes (Signed)
Subjective:    Patient ID: Emily Mcgee, female    DOB: September 30, 1965, 52 y.o.   MRN: [id number]  HPI 52 year old female new to the practice.  Patient's past medical history is significant for hypertension and chronic right knee pain as well as morbid obesity.  Patient states that she has been able to have her blood pressure medication discontinued after losing weight.  Patient was previously on lisinopril.  Patient states that she has made small changes such as portion control and walking for further distances.  Patient however is limited in her ability to walk secondary to chronic pain in her right knee.  Patient states that she was told that this was from arthritis but she has had no x-rays.  Patient states that the pain in her knee is always around 8 and sometimes higher.  Patient states that she has taken over-the-counter medications in the past for pain and recently tried a supplement possibly called Move Free from [facility] without any difference in the level of pain.  Patient states that the pain is greatest at night and sometimes keeps her from sleeping.  Pain also occurs with changes in position such as getting up from a seated position if she has been in one place for a while.  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  Patient states that the knee pain is sharp and sometimes throbbing.      Patient reports at today's visit that she also has a toothache.  Patient states that she has had pain in the right upper molar on and off for almost a year.  Patient states that the pain restarted this Sunday.  Pain is about an 8 on a 0-to-10 scale.  Pain is throbbing at times.  Patient does not know if it is caused any facial swelling but patient has increased pain with chewing.  Patient has been unable to see a dentist.  Patient would like to be referred to a dentist. Past Medical History:  Diagnosis Date  . Back pain   . Decreased visual  acuity 2015   bifocals  . Knee pain   . Morbid obesity (HCC)    Past Surgical History:  Procedure Laterality Date  . CESAREAN SECTION  1995, 1997, 2002   Social History   Tobacco Use  . Smoking status: Never Smoker  . Smokeless tobacco: Never Used  Substance Use Topics  . Alcohol use: Not Currently    Alcohol/week: 1.0 standard drinks    Types: 1 Glasses of wine per week    Comment: rare  . Drug use: No   Family History  Problem Relation Age of Onset  . Stroke Father   No Known Allergies   Review of Systems  Constitutional: Positive for fatigue (Occasional, patient works 2 jobs). Negative for chills and fever.  HENT: Positive for dental problem. Negative for ear pain, sore throat and trouble swallowing.   Respiratory: Negative for cough and shortness of breath.   Cardiovascular: Positive for leg swelling (Patient sometimes feels as if she is retaining fluid). Negative for chest pain and palpitations.  Gastrointestinal: Negative for abdominal pain and nausea.  Genitourinary: Negative for dysuria and frequency.  Musculoskeletal: Positive for arthralgias, back pain, gait problem and joint swelling.  Neurological: Negative for dizziness and headaches.       Objective:   Physical Exam BP 132/85 (BP Location: Left Arm, Patient Position: Sitting, Cuff Size: Large)  Pulse 83   Temp 98.4 F (36.9 C) (Oral)   Resp (!) 22   Ht 5' 3.5" (1.613 m)   Wt (!) 363 lb 12.8 oz (165 kg)   SpO2 98%   BMI 63.43 kg/m  Vital signs and nurse's notes reviewed General- well-nourished, well-developed morbidly obese very pleasant older female in no acute distress.  Patient is sitting in exam chair. ENT-TMs gray, nares with mild edema of the nasal turbinates, patient with somewhat narrowed posterior airway with a large tongue base.  Patient with some swelling and tenderness at the gum and along the tooth of the right upper posterior molar Neck-supple, no lymphadenopathy, no thyromegaly, no  carotid bruit Cardiovascular-regular rate and rhythm Abdomen-truncal obesity, soft and nontender Back-no CVA tenderness, positive lumbosacral discomfort to palpation Musculoskeletal- patient with bilateral joint line tenderness of the right knee Extremities- patient with bilateral lower extremity edema right greater than left, nonpitting and patient with pedal edema of the right foot.  Patient with possible lymphedema      Assessment & Plan:  1. Tooth pain Patient with recurrent pain in the right upper posterior molar and patient with some discomfort to palpation and some gum edema on exam.  Patient will be placed on amoxicillin and ibuprofen and referral will be made to dentistry for further evaluation and treatment - Ambulatory referral to Dentistry - amoxicillin (AMOXIL) 500 MG capsule; Take 1 capsule (500 mg total) by mouth 2 (two) times daily.  Dispense: 20 capsule; Refill: 0 - ibuprofen (ADVIL,MOTRIN) 600 MG tablet; Take 1 tablet (600 mg total) by mouth every 8 (eight) hours as needed. Take after eating  Dispense: 60 tablet; Refill: 3  2. Chronic pain of right knee Patient with chronic pain of the right knee which is likely secondary to osteoarthritis.  Patient with gait impairment secondary to knee pain.  Patient is given prescription for ibuprofen 600 mg to take every 8 hours as needed for pain while she is awaiting referral to orthopedics for further evaluation and treatment - Ambulatory referral to Orthopedic Surgery - ibuprofen (ADVIL,MOTRIN) 600 MG tablet; Take 1 tablet (600 mg total) by mouth every 8 (eight) hours as needed. Take after eating  Dispense: 60 tablet; Refill: 3  3. Morbid obesity (HCC) Patient is congratulated on her weight loss efforts as her notes indicate that she has lost weight over time and patient has now been able to get off of her blood pressure medication.  Patient is encouraged to continue portion control size, healthy diet and remain as active as  possible  4. Need for immunization against influenza Patient was offered and agreed to have influenza immunization at today's visit  An After Visit Summary was printed and given to the patient.  Return in about 6 weeks (around 11/16/2017) for knee pain/fasting labs.

## 2017-11-16 ENCOUNTER — Ambulatory Visit: Payer: Self-pay | Admitting: Family Medicine

## 2017-11-16 ENCOUNTER — Other Ambulatory Visit: Payer: Self-pay

## 2017-11-23 MED FILL — IBUPROFEN 600 MG TABLET: 600 | 20 days supply | Qty: 60 | Fill #1

## 2017-12-24 ENCOUNTER — Other Ambulatory Visit: Payer: Self-pay

## 2017-12-24 ENCOUNTER — Ambulatory Visit: Payer: Self-pay | Admitting: Internal Medicine

## 2017-12-24 ENCOUNTER — Encounter: Payer: Self-pay | Admitting: Internal Medicine

## 2017-12-24 VITALS — BP 150/98 | HR 72 | Resp 14 | Ht 61.5 in

## 2017-12-24 DIAGNOSIS — Z79899 Other long term (current) drug therapy: Secondary | ICD-10-CM

## 2017-12-24 DIAGNOSIS — I10 Essential (primary) hypertension: Secondary | ICD-10-CM

## 2017-12-24 DIAGNOSIS — M25561 Pain in right knee: Secondary | ICD-10-CM

## 2017-12-24 DIAGNOSIS — G8929 Other chronic pain: Secondary | ICD-10-CM

## 2017-12-24 DIAGNOSIS — R011 Cardiac murmur, unspecified: Secondary | ICD-10-CM

## 2017-12-24 MED ORDER — LISINOPRIL 5 MG PO TABS: 5.0000 mg | ORAL_TABLET | Freq: Every day | ORAL | 11 refills | Status: DC

## 2017-12-24 MED ORDER — MELOXICAM 15 MG PO TABS: ORAL_TABLET | ORAL | 4 refills | Status: DC

## 2017-12-24 NOTE — Patient Instructions (Addendum)
Drink a glass of water before every meal Drink 6-8 glasses of water daily Eat three meals daily Eat a protein and healthy fat with every meal (eggs,fish, chicken, [place] and limit red meats) Eat 5 servings of vegetables daily, mix the colors Eat 2 servings of fruit daily with skin, if skin is edible Use smaller plates Put food/utensils down as you chew and swallow each bite Eat at a table with friends/family at least once daily, no TV Do not eat in front of the TV  Recent studies show that people who consume all of their calories in a 12 hour period lose weight more efficiently.  For example, if you eat your first meal at 7:00 a.m., your last meal of the day should be completed by 7:00 p.m.  Make small goals:  [facility] [employer] [facility], applying for financial aid with [facility] (call us if that goes through so we can redirect xrays), or plan B--[employer] and get on recumbant bike) Make goals with eating as well
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
## 2017-12-25 LAB — CBC WITH DIFFERENTIAL/PLATELET
BASOS ABS: 0 10*3/uL (ref 0.0–0.2)
Basos: 1 %
EOS (ABSOLUTE): 0.2 10*3/uL (ref 0.0–0.4)
Eos: 2 %
Hematocrit: 37.5 % (ref 34.0–46.6)
Hemoglobin: 11.6 g/dL (ref 11.1–15.9)
Immature Grans (Abs): 0 10*3/uL (ref 0.0–0.1)
Immature Granulocytes: 0 %
LYMPHS ABS: 2.1 10*3/uL (ref 0.7–3.1)
Lymphs: 32 %
MCH: 24.5 pg — ABNORMAL LOW (ref 26.6–33.0)
MCHC: 30.9 g/dL — AB (ref 31.5–35.7)
MCV: 79 fL (ref 79–97)
Monocytes Absolute: 0.6 10*3/uL (ref 0.1–0.9)
Monocytes: 9 %
NEUTROS ABS: 3.6 10*3/uL (ref 1.4–7.0)
Neutrophils: 56 %
PLATELETS: 284 10*3/uL (ref 150–450)
RBC: 4.74 x10E6/uL (ref 3.77–5.28)
RDW: 15.7 % — AB (ref 12.3–15.4)
WBC: 6.4 10*3/uL (ref 3.4–10.8)

## 2017-12-25 LAB — COMPREHENSIVE METABOLIC PANEL
ALT: 12 IU/L (ref 0–32)
AST: 13 IU/L (ref 0–40)
Albumin/Globulin Ratio: 1.3 (ref 1.2–2.2)
Albumin: 3.6 g/dL (ref 3.5–5.5)
Alkaline Phosphatase: 58 IU/L (ref 39–117)
BUN/Creatinine Ratio: 19 (ref 9–23)
BUN: 12 mg/dL (ref 6–24)
Bilirubin Total: 0.2 mg/dL (ref 0.0–1.2)
CALCIUM: 8.7 mg/dL (ref 8.7–10.2)
CO2: 26 mmol/L (ref 20–29)
Chloride: 103 mmol/L (ref 96–106)
Creatinine, Ser: 0.64 mg/dL (ref 0.57–1.00)
GFR calc Af Amer: 118 mL/min/{1.73_m2} (ref >59)
GFR, EST NON AFRICAN AMERICAN: 102 mL/min/{1.73_m2} (ref >59)
Globulin, Total: 2.7 g/dL (ref 1.5–4.5)
Glucose: 82 mg/dL (ref 65–99)
Potassium: 4.6 mmol/L (ref 3.5–5.2)
Sodium: 144 mmol/L (ref 134–144)
Total Protein: 6.3 g/dL (ref 6.0–8.5)

## 2017-12-25 LAB — HGB A1C W/O EAG: Hgb A1c MFr Bld: 5.8 % — ABNORMAL HIGH (ref 4.8–5.6)

## 2018-01-04 ENCOUNTER — Other Ambulatory Visit: Payer: Self-pay

## 2018-01-04 VITALS — BP 138/88 | HR 80

## 2018-01-04 DIAGNOSIS — Z13220 Encounter for screening for lipoid disorders: Secondary | ICD-10-CM

## 2018-01-04 DIAGNOSIS — Z79899 Other long term (current) drug therapy: Secondary | ICD-10-CM

## 2018-01-04 DIAGNOSIS — I10 Essential (primary) hypertension: Secondary | ICD-10-CM

## 2018-01-04 NOTE — Progress Notes (Signed)
Patient BP now in normal range. Patient did not take BP medication this morning before coming into office. Patient educated on importance of taking medication daily at the same time.. patient verbalized understanding.

## 2018-01-05 LAB — BASIC METABOLIC PANEL
BUN/Creatinine Ratio: 22 (ref 9–23)
BUN: 17 mg/dL (ref 6–24)
CALCIUM: 9.3 mg/dL (ref 8.7–10.2)
CO2: 26 mmol/L (ref 20–29)
Chloride: 104 mmol/L (ref 96–106)
Creatinine, Ser: 0.79 mg/dL (ref 0.57–1.00)
GFR calc Af Amer: 99 mL/min/{1.73_m2} (ref >59)
GFR calc non Af Amer: 86 mL/min/{1.73_m2} (ref >59)
Glucose: 88 mg/dL (ref 65–99)
Potassium: 4.4 mmol/L (ref 3.5–5.2)
Sodium: 146 mmol/L — ABNORMAL HIGH (ref 134–144)

## 2018-01-05 LAB — LIPID PANEL W/O CHOL/HDL RATIO
Cholesterol, Total: 227 mg/dL — ABNORMAL HIGH (ref 100–199)
HDL: 61 mg/dL (ref >39)
LDL Calculated: 156 mg/dL — ABNORMAL HIGH (ref 0–99)
Triglycerides: 50 mg/dL (ref 0–149)
VLDL Cholesterol Cal: 10 mg/dL (ref 5–40)

## 2018-03-25 ENCOUNTER — Ambulatory Visit: Payer: Self-pay | Admitting: Internal Medicine

## 2018-06-24 ENCOUNTER — Other Ambulatory Visit: Payer: Self-pay | Admitting: Internal Medicine

## 2018-10-16 ENCOUNTER — Telehealth (INDEPENDENT_AMBULATORY_CARE_PROVIDER_SITE_OTHER): Payer: Self-pay | Admitting: Internal Medicine

## 2018-10-16 DIAGNOSIS — I10 Essential (primary) hypertension: Secondary | ICD-10-CM

## 2018-10-16 DIAGNOSIS — K0889 Other specified disorders of teeth and supporting structures: Secondary | ICD-10-CM

## 2018-10-16 DIAGNOSIS — G8929 Other chronic pain: Secondary | ICD-10-CM

## 2018-10-16 DIAGNOSIS — M25561 Pain in right knee: Secondary | ICD-10-CM

## 2018-10-16 MED ORDER — MELOXICAM 15 MG PO TABS: ORAL_TABLET | ORAL | 4 refills | Status: AC

## 2018-10-16 NOTE — Progress Notes (Signed)
    Subjective:    Patient ID: Emily Mcgee, female   DOB: 08/25/1965, 53 y.o.   MRN: [id number]   HPI   Virtual [facility] visit. No back pain--a follow up on her knee pain.  1.  Right knee pain:  Did go to [facility] with 3 visits she states was likely in July.  Held up by pandemic. Missed follow up in March just as Pandemic stay at home orders put into place. Had xray of knee done finally in July.   Could not find the results during this visit--but able to find later in different view with Care Everywhere tab:  Actually performed 07/10/2018 with Moderate to severe medial and lateral compartment DJD, mild patellofemoral DJD.  Moderate to large effusion. PT visits stopped due to work scheduling conflicts. She was given a home exercise program, which she states she has done regularly, but no improvement. Hurts to climb stairs, but a bit fearful coming down as well. She does feel her knee catches, particularly with climbing stairs, and she does note grinding. She cannot say if there is swelling--legs are morbidly obese. On video, points to the lateral joint aspect of the knee as source of most of the pain  2.  Dental pain:  Right lower jaw now, but has had on the left.  Difficulties with cold and hot liquids and solids.  Using Sensodyne helps.  No swelling or fever.  3.  Hypertension:  She cannot say what her last bp was at PT visit in July.  Lisinopril.  Was improved with last nurse visit end of 2019.  Current Meds  Medication Sig  . lisinopril (PRINIVIL,ZESTRIL) 5 MG tablet Take 1 tablet (5 mg total) by mouth daily.   No Known Allergies   Review of Systems    Objective:   Physical Exam NAD Knees and legs are quite obese and difficult to assess via video.  Assessment & Plan  1.  Right knee pain:  Significant DJD and effusion with no improvement with PT. Asked her to get into water exercise or bicycling without deep knee bending to keep leg muscles strong.  Referral to Ortho and will need to apply for [facility] Will put in for MR of knee, but not sure that will get done before ortho referral.  2.  Dental pain:  Referral to Dental clinic.  Continue Sensodyne for now.   3.  Hypertension:  Will need follow up in near future.

## 2018-10-30 ENCOUNTER — Other Ambulatory Visit: Payer: Self-pay

## 2018-10-30 ENCOUNTER — Ambulatory Visit (INDEPENDENT_AMBULATORY_CARE_PROVIDER_SITE_OTHER): Payer: Self-pay | Admitting: Orthopaedic Surgery

## 2018-10-30 ENCOUNTER — Ambulatory Visit: Payer: Self-pay

## 2018-10-30 ENCOUNTER — Encounter: Payer: Self-pay | Admitting: Orthopaedic Surgery

## 2018-10-30 DIAGNOSIS — G8929 Other chronic pain: Secondary | ICD-10-CM

## 2018-10-30 DIAGNOSIS — M25561 Pain in right knee: Secondary | ICD-10-CM

## 2018-10-30 NOTE — Progress Notes (Signed)
Subjective: I was asked to try to inject her right knee with ultrasound guidance.  Unfortunately I was unable to adequately visualize the joint recess.  I believe she would be best served by having Dr. Ernestina Patches injected under fluoroscopy using a longer spinal needle.  We will arrange this for her.

## 2018-10-30 NOTE — Progress Notes (Signed)
Office Visit Note   Patient: Emily Mcgee           Date of Birth: January 18, 1965           MRN: [id number] Visit Date: 10/30/2018              Requested by: Emily Manson, MD [address] [place],  [place] [address] PCP: Emily Manson, MD   Assessment & Plan: Visit Diagnoses:  1. Chronic pain of right knee     Plan: Discussed with her quad strengthening exercises.  She will continue her Mobic.  Like to see her back in a month.  Today we will have her undergo an injection of the right knee under ultrasound due to the fact that her large soft tissue envelope makes it difficult for the injection without visualization.  However Dr. Prince Mcgee was unable to perform this due to her leg size and therefore she will be scheduled to have this done under fluoroscopy.  Questions were encouraged and answered at length.  Discussed weight loss with her.  Follow-Up Instructions: Return in about 2 weeks (around 11/13/2018).   Orders:  Orders Placed This Encounter  Procedures  . US Guided Needle Placement   No orders of the defined types were placed in this encounter.     Procedures: No procedures performed   Clinical Data: No additional findings.   Subjective: Chief Complaint  Patient presents with  . Right Knee - Pain    HPI Mrs. Emily Mcgee is a very pleasant 53 year old female comes in today with right.  Left knee pain.  Pain is been ongoing for years.  She has difficulty with going up and down stairs.  She states that she has been going to physical therapy since July no improvement with this.  She also has tried Mobic which has helped some.  She had radiographs of her knees obtained which was seen on care everywhere were done in December 19 actual images are not available but the right knee did show tricompartmental changes with moderate to severe changes.  No acute fractures. Review of Systems   Objective: Vital Signs: LMP  (LMP Unknown)   Physical Exam Constitutional:       Appearance: She is obese. She is not ill-appearing or diaphoretic.  Pulmonary:     Effort: Pulmonary effort is normal.  Neurological:     Mental Status: She is alert and oriented to person, place, and time.  Psychiatric:        Mood and Affect: Mood normal.     Ortho Exam Bilateral knees no abnormal warmth erythema.  No instability valgus varus stressing.  Significant patellofemoral crepitus.  Peripatellar tenderness.  Tenderness along medial lateral joint lines both knees.  Do to soft tissue envelope difficult to assess both knees.  Specialty Comments:  No specialty comments available.  Imaging: US Guided Needle Placement  Result Date: 10/30/2018 Please see Notes tab for imaging impression.    PMFS History: Patient Active Problem List   Diagnosis Date Noted  . Heart murmur, systolic 12/24/2017  . Right knee pain 08/07/2017  . Hypertension 08/07/2017  . Morbid obesity (HCC) 09/07/2015   Past Medical History:  Diagnosis Date  . Back pain   . Decreased visual acuity 2015   bifocals  . Knee pain   . Morbid obesity (HCC)     Family History  Problem Relation Age of Onset  . Stroke Father     Past Surgical History:  Procedure Laterality Date  .  [place], 2002   Social History   Occupational History  . 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 Smoking status: Never Smoker  . Smokeless tobacco: Never Used  Substance and Sexual Activity  . Alcohol use: Not Currently    Alcohol/week: 1.0 standard drinks    Types: 1 Glasses of wine per week    Comment: rare  . Drug use: No  . Sexual activity: Never

## 2018-11-07 ENCOUNTER — Other Ambulatory Visit: Payer: Self-pay

## 2018-11-07 ENCOUNTER — Ambulatory Visit: Payer: Self-pay

## 2018-11-07 ENCOUNTER — Encounter: Payer: Self-pay | Admitting: Physical Medicine and Rehabilitation

## 2018-11-07 ENCOUNTER — Ambulatory Visit (INDEPENDENT_AMBULATORY_CARE_PROVIDER_SITE_OTHER): Payer: Self-pay | Admitting: Physical Medicine and Rehabilitation

## 2018-11-07 DIAGNOSIS — G8929 Other chronic pain: Secondary | ICD-10-CM

## 2018-11-07 DIAGNOSIS — M25561 Pain in right knee: Secondary | ICD-10-CM

## 2018-11-07 MED ORDER — BUPIVACAINE HCL 0.25 % IJ SOLN
4.0000 mL | INTRAMUSCULAR | Status: AC | PRN
  Administered 2018-11-07: 4 mL via INTRA_ARTICULAR

## 2018-11-07 MED ORDER — TRIAMCINOLONE ACETONIDE 40 MG/ML IJ SUSP
40.0000 mg | INTRAMUSCULAR | Status: AC | PRN
  Administered 2018-11-07: 11:00:00 40 mg via INTRA_ARTICULAR

## 2018-11-07 NOTE — Progress Notes (Signed)
   Emily Mcgee - 53 y.o. female MRN [id number]  Date of birth: Jun 06, 1965  Office Visit Note: Visit Date: 11/07/2018 PCP: Mack Hook, MD Referred by: Mack Hook, MD  Subjective: Chief Complaint  Patient presents with  . Right Knee - Pain   HPI:  Emily Mcgee is a 53 y.o. female who comes in today At the request of Benita Stabile, PA-C and Dr. Jean Rosenthal for diagnostic medically therapeutic right intra-articular knee injection fluoroscopic guidance.  Prior injection was attempted using ultrasound guidance with Dr. Junius Roads and just unable to visualize the knee joint dated the patient's body habitus.  She does have morbid obesity.  She is having bilateral knee pain right more than left worse with movement and standing.  We will inject today and Dr. Ninfa Linden did not see her back after the injection so we will make that appointment.  ROS Otherwise per HPI.  Assessment & Plan: Visit Diagnoses:  1. Chronic pain of right knee     Plan: No additional findings.   Meds & Orders: No orders of the defined types were placed in this encounter.   Orders Placed This Encounter  Procedures  . Large Joint Inj: R knee  . XR C-ARM NO REPORT    Follow-up: Return in about 2 weeks (around 11/21/2018) for Jean Rosenthal, M.D..   Procedures: Large Joint Inj: R knee on 11/07/2018 11:14 AM Indications: pain and diagnostic evaluation Details: 22 G 3.5 in needle, fluoroscopy-guided anterolateral approach  Arthrogram: No  Medications: 4 mL bupivacaine 0.25 %; 40 mg triamcinolone acetonide 40 MG/ML Outcome: tolerated well, no immediate complications  There was excellent flow of contrast producing a partial arthrogram of the knee. The patient did have relief of symptoms during the anesthetic phase of the injection.  Consent was given by the patient. Immediately prior to procedure a time out was called to verify the correct patient, procedure, equipment, support staff  and site/side marked as required. Patient was prepped and draped in the usual sterile fashion.      No notes on file   Clinical History: No specialty comments available.     Objective:  VS:  HT:    WT:   BMI:     BP:   HR: bpm  TEMP: ( )  RESP:  Physical Exam  Ortho Exam Imaging: No results found.

## 2018-11-07 NOTE — Progress Notes (Signed)
Pt states pain in the right knee. Pt states pain started 3 years ago. Walking and sleeping makes pain worse. Pt states a cream that numbs her skin helps with pain.   .Numeric Pain Rating Scale and Functional Assessment Average Pain 10   In the last MONTH (on 0-10 scale) has pain interfered with the following?  1. General activity like being  able to carry out your everyday physical activities such as walking, climbing stairs, carrying groceries, or moving a chair?  Rating(10)    -Dye Allergies.

## 2018-11-21 ENCOUNTER — Encounter: Payer: Self-pay | Admitting: Orthopaedic Surgery

## 2018-11-21 ENCOUNTER — Ambulatory Visit (INDEPENDENT_AMBULATORY_CARE_PROVIDER_SITE_OTHER): Payer: Self-pay | Admitting: Orthopaedic Surgery

## 2018-11-21 VITALS — Ht 61.5 in | Wt 363.0 lb

## 2018-11-21 DIAGNOSIS — G8929 Other chronic pain: Secondary | ICD-10-CM

## 2018-11-21 DIAGNOSIS — M25562 Pain in left knee: Secondary | ICD-10-CM

## 2018-11-21 DIAGNOSIS — M25561 Pain in right knee: Secondary | ICD-10-CM

## 2018-11-21 MED ORDER — LIDOCAINE HCL 1 % IJ SOLN
3.0000 mL | INTRAMUSCULAR | Status: AC | PRN
  Administered 2018-11-21: 16:00:00 3 mL

## 2018-11-21 MED ORDER — METHYLPREDNISOLONE ACETATE 40 MG/ML IJ SUSP
40.0000 mg | INTRAMUSCULAR | Status: AC | PRN
  Administered 2018-11-21: 40 mg via INTRA_ARTICULAR

## 2018-11-21 NOTE — Progress Notes (Signed)
Office Visit Note   Patient: Emily Mcgee           Date of Birth: 10-18-1965           MRN: [id number] Visit Date: 11/21/2018              Requested by: Mack Hook, MD [place],  [place] [id number] PCP: Mack Hook, MD   Assessment & Plan: Visit Diagnoses:  1. Chronic pain of right knee   2. Chronic pain of left knee     Plan: I was able to surprisingly inject her left knee joint with a steroid.  I had her pull up on her soft tissue on her thigh and I was able to target her knee.  The injection went in smoothly and easily so I do feel like again in the knee joint and she feels like it did go around her knee.  Only time will tell.  She understands that we need to wait at least 4 months between injections.  All question concerns were answered and addressed.  Follow-up is as needed.  Follow-Up Instructions: Return if symptoms worsen or fail to improve.   Orders:  No orders of the defined types were placed in this encounter.  No orders of the defined types were placed in this encounter.     Procedures: Large Joint Inj: L knee on 11/21/2018 4:18 PM Indications: diagnostic evaluation and pain Details: 22 G 1.5 in needle, superolateral approach  Arthrogram: No  Medications: 3 mL lidocaine 1 %; 40 mg methylPREDNISolone acetate 40 MG/ML Outcome: tolerated well, no immediate complications Procedure, treatment alternatives, risks and benefits explained, specific risks discussed. Consent was given by the patient. Immediately prior to procedure a time out was called to verify the correct patient, procedure, equipment, support staff and site/side marked as required. Patient was prepped and draped in the usual sterile fashion.       Clinical Data: No additional findings.   Subjective: Chief Complaint  Patient presents with  . Right Knee - Follow-up  The patient comes in for follow-up after having a fluoroscopic guided intra-articular injection  of steroid into her right knee.  It was too difficult to do this under ultrasound.  She is significantly obese with a BMI of 67.  She said the injection has helped.  She is interested in water aerobics once the Covid pandemic is gotten under better control and she is allowed to go for water aerobics.  She is interested in trying a steroid injection in her left knee today if I can get it in there.  She understands the difficulty due to her soft tissue around the knee and her obesity.  HPI  Review of Systems She currently denies any headache, chest pain, shortness of breath, fever, chills, nausea, vomiting  Objective: Vital Signs: Ht 5' 1.5" (1.562 m)   Wt (!) 363 lb (164.7 kg)   LMP  (LMP Unknown)   BMI 67.48 kg/m   Physical Exam She is alert and orient x3 and in no acute distress Ortho Exam Examination of both knees shows a very large soft subcu envelope around both knees.  Her extensor mechanism is intact.  It is too difficult to get the ligaments exam out of her knees. Specialty Comments:  No specialty comments available.  Imaging: No results found.   PMFS History: Patient Active Problem List   Diagnosis Date Noted  . Heart murmur, systolic 77/41/2878  . Right knee pain 08/07/2017  .  Hypertension 08/07/2017  . Morbid obesity (HCC) 09/07/2015   Past Medical History:  Diagnosis Date  . Back pain   . Decreased visual acuity 2015   bifocals  . Knee pain   . Morbid obesity (HCC)     Family History  Problem Relation Age of Onset  . Stroke Father     Past Surgical History:  Procedure Laterality Date  . CESAREAN SECTION  1995, 1997, 2002   Social History   Occupational History  . 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 Smoking status: Never Smoker  . Smokeless tobacco: Never Used  Substance and Sexual Activity  . Alcohol use: Not Currently    Alcohol/week: 1.0 standard drinks    Types: 1 Glasses of  wine per week    Comment: rare  . Drug use: No  . Sexual activity: Never

## 2018-12-27 ENCOUNTER — Other Ambulatory Visit: Payer: Self-pay

## 2018-12-27 ENCOUNTER — Ambulatory Visit: Payer: Self-pay | Attending: Internal Medicine

## 2018-12-27 DIAGNOSIS — Z20822 Contact with and (suspected) exposure to covid-19: Secondary | ICD-10-CM

## 2018-12-28 LAB — NOVEL CORONAVIRUS, NAA: SARS-CoV-2, NAA: NOT DETECTED

## 2019-01-24 ENCOUNTER — Other Ambulatory Visit: Payer: Self-pay | Admitting: Internal Medicine

## 2019-02-09 ENCOUNTER — Encounter: Payer: Self-pay | Admitting: Internal Medicine

## 2019-02-09 DIAGNOSIS — I10 Essential (primary) hypertension: Secondary | ICD-10-CM | POA: Insufficient documentation

## 2019-02-26 ENCOUNTER — Other Ambulatory Visit: Payer: Self-pay | Admitting: Internal Medicine

## 2019-03-26 ENCOUNTER — Ambulatory Visit: Payer: Self-pay | Admitting: Internal Medicine

## 2021-10-05 ENCOUNTER — Other Ambulatory Visit: Payer: Self-pay | Admitting: Internal Medicine

## 2021-10-05 DIAGNOSIS — Z1231 Encounter for screening mammogram for malignant neoplasm of breast: Secondary | ICD-10-CM

## 2021-10-08 ENCOUNTER — Inpatient Hospital Stay: Admission: RE | Admit: 2021-10-08 | Payer: Self-pay | Source: Ambulatory Visit
